# Patient Record
Sex: Male | Born: 2000 | Race: White | Hispanic: No | Marital: Single | Smoking: Current every day smoker
Health system: Southern US, Community
[De-identification: ages and names within clinical notes are randomized; demographics above are authoritative.]

## PROBLEM LIST (undated history)

## (undated) DIAGNOSIS — F913 Oppositional defiant disorder: Secondary | ICD-10-CM

## (undated) DIAGNOSIS — R454 Irritability and anger: Secondary | ICD-10-CM

## (undated) DIAGNOSIS — F909 Attention-deficit hyperactivity disorder, unspecified type: Secondary | ICD-10-CM

## (undated) HISTORY — DX: Attention-deficit hyperactivity disorder, unspecified type: F90.9

---
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
## 2019-06-08 ENCOUNTER — Other Ambulatory Visit: Payer: Self-pay

## 2019-06-09 ENCOUNTER — Encounter: Payer: Self-pay | Admitting: Nurse Practitioner

## 2019-06-09 ENCOUNTER — Ambulatory Visit (INDEPENDENT_AMBULATORY_CARE_PROVIDER_SITE_OTHER): Payer: Medicaid Other | Admitting: Nurse Practitioner

## 2019-06-09 DIAGNOSIS — F19982 Other psychoactive substance use, unspecified with psychoactive substance-induced sleep disorder: Secondary | ICD-10-CM

## 2019-06-09 DIAGNOSIS — F913 Oppositional defiant disorder: Secondary | ICD-10-CM | POA: Diagnosis not present

## 2019-06-09 DIAGNOSIS — F902 Attention-deficit hyperactivity disorder, combined type: Secondary | ICD-10-CM

## 2019-06-09 MED ORDER — LISDEXAMFETAMINE DIMESYLATE 60 MG PO CAPS: 60.0000 mg | ORAL_CAPSULE | ORAL | 0 refills | Status: AC

## 2019-06-09 MED ORDER — GUANFACINE HCL 2 MG PO TABS: ORAL_TABLET | ORAL | 5 refills | Status: AC

## 2019-06-09 MED ORDER — CLONIDINE HCL 0.3 MG PO TABS: 0.3000 mg | ORAL_TABLET | Freq: Every day | ORAL | 3 refills | Status: AC

## 2019-06-09 NOTE — Patient Instructions (Signed)
Stress, Adult Stress is a normal reaction to life events. Stress is what you feel when life demands more than you are used to, or more than you think you can handle. Some stress can be useful, such as studying for a test or meeting a deadline at work. Stress that occurs too often or for too long can cause problems. It can affect your emotional health and interfere with relationships and normal daily activities. Too much stress can weaken your body's defense system (immune system) and increase your risk for physical illness. If you already have a medical problem, stress can make it worse. What are the causes? All sorts of life events can cause stress. An event that causes stress for one person may not be stressful for another person. Major life events, whether positive or negative, commonly cause stress. Examples include:  Losing a job or starting a new job.  Losing a loved one.  Moving to a new town or home.  Getting married or divorced.  Having a baby.  Getting injured or sick. Less obvious life events can also cause stress, especially if they occur day after day or in combination with each other. Examples include:  Working long hours.  Driving in traffic.  Caring for children.  Being in debt.  Being in a difficult relationship. What are the signs or symptoms? Stress can cause emotional symptoms, including:  Anxiety. This is feeling worried, afraid, on edge, overwhelmed, or out of control.  Anger, including irritation or impatience.  Depression. This is feeling sad, down, helpless, or guilty.  Trouble focusing, remembering, or making decisions. Stress can cause physical symptoms, including:  Aches and pains. These may affect your head, neck, back, stomach, or other areas of your body.  Tight muscles or a clenched jaw.  Low energy.  Trouble sleeping. Stress can cause unhealthy behaviors, including:  Eating to feel better (overeating) or skipping meals.  Working too  much or putting off tasks.  Smoking, drinking alcohol, or using drugs to feel better. How is this diagnosed? Stress is diagnosed through an assessment by your health care provider. He or she may diagnose this condition based on:  Your symptoms and any stressful life events.  Your medical history.  Tests to rule out other causes of your symptoms. Depending on your condition, your health care provider may refer you to a specialist for further evaluation. How is this treated?  Stress management techniques are the recommended treatment for stress. Medicine is not typically recommended for the treatment of stress. Techniques to reduce your reaction to stressful life events include:  Stress identification. Monitor yourself for symptoms of stress and identify what causes stress for you. These skills may help you to avoid or prepare for stressful events.  Time management. Set your priorities, keep a calendar of events, and learn to say no. Taking these actions can help you avoid making too many commitments. Techniques for coping with stress include:  Rethinking the problem. Try to think realistically about stressful events rather than ignoring them or overreacting. Try to find the positives in a stressful situation rather than focusing on the negatives.  Exercise. Physical exercise can release both physical and emotional tension. The key is to find a form of exercise that you enjoy and do it regularly.  Relaxation techniques. These relax the body and mind. The key is to find one or more that you enjoy and use the techniques regularly. Examples include: ? Meditation, deep breathing, or progressive relaxation techniques. ? Yoga or   tai chi. ? Biofeedback, mindfulness techniques, or journaling. ? Listening to music, being out in nature, or participating in other hobbies.  Practicing a healthy lifestyle. Eat a balanced diet, drink plenty of water, limit or avoid caffeine, and get plenty of  sleep.  Having a strong support network. Spend time with family, friends, or other people you enjoy being around. Express your feelings and talk things over with someone you trust. Counseling or talk therapy with a mental health professional may be helpful if you are having trouble managing stress on your own. Follow these instructions at home: Lifestyle   Avoid drugs.  Do not use any products that contain nicotine or tobacco, such as cigarettes, e-cigarettes, and chewing tobacco. If you need help quitting, ask your health care provider.  Limit alcohol intake to no more than 1 drink a day for nonpregnant women and 2 drinks a day for men. One drink equals 12 oz of beer, 5 oz of wine, or 1 oz of hard liquor  Do not use alcohol or drugs to relax.  Eat a balanced diet that includes fresh fruits and vegetables, whole grains, lean meats, fish, eggs, and beans, and low-fat dairy. Avoid processed foods and foods high in added fat, sugar, and salt.  Exercise at least 30 minutes on 5 or more days each week.  Get 7-8 hours of sleep each night. General instructions   Practice stress management techniques as discussed with your health care provider.  Drink enough fluid to keep your urine clear or pale yellow.  Take over-the-counter and prescription medicines only as told by your health care provider.  Keep all follow-up visits as told by your health care provider. This is important. Contact a health care provider if:  Your symptoms get worse.  You have new symptoms.  You feel overwhelmed by your problems and can no longer manage them on your own. Get help right away if:  You have thoughts of hurting yourself or others. If you ever feel like you may hurt yourself or others, or have thoughts about taking your own life, get help right away. You can go to your nearest emergency department or call:  Your local emergency services (911 in the [place]  A suicide crisis helpline, such as the  [place] at (316) 250-6172. This is open 24 hours a day. Summary  Stress is a normal reaction to life events. It can cause problems if it happens too often or for too long.  Practicing stress management techniques is the best way to treat stress.  Counseling or talk therapy with a mental health professional may be helpful if you are having trouble managing stress on your own. This information is not intended to replace advice given to you by your health care provider. Make sure you discuss any questions you have with your health care provider. Document Revised: 12/16/2018 Document Reviewed: 07/08/2016 Elsevier Patient Education  [place].

## 2019-06-09 NOTE — Progress Notes (Signed)
   Subjective:    Patient ID: Jordan Fleming, male    DOB: 06-23-2000, 19 y.o.   MRN: [id number]   Chief Complaint: Recheck ADHD   HPI Patient comes in  Today for recheck of his ADHD. He is currently on vyvanse 60 mg and tenex 2mg  daily. He takes clonidine to sleep at night. He says he id doing well without any side effects. He has not been taking daily as matter of fact he has not had it filled since 2019. He says he needs them because he cannot concentrate and his attitude is worsening.   Review of Systems  Constitutional: Negative for diaphoresis.  Eyes: Negative for pain.  Respiratory: Negative for shortness of breath.   Cardiovascular: Negative for chest pain, palpitations and leg swelling.  Gastrointestinal: Negative for abdominal pain.  Endocrine: Negative for polydipsia.  Skin: Negative for rash.  Neurological: Negative for dizziness, weakness and headaches.  Hematological: Does not bruise/bleed easily.  All other systems reviewed and are negative.      Objective:   Physical Exam Vitals and nursing note reviewed.  Constitutional:      Appearance: Normal appearance.  Cardiovascular:     Rate and Rhythm: Normal rate and regular rhythm.     Heart sounds: Normal heart sounds.  Pulmonary:     Breath sounds: Normal breath sounds.  Skin:    General: Skin is warm.  Neurological:     General: No focal deficit present.     Mental Status: He is alert and oriented to person, place, and time.  Psychiatric:        Mood and Affect: Mood normal.        Behavior: Behavior normal.    BP 124/80   Pulse 60   Temp 99.1 F (37.3 C) (Temporal)   Resp 20   Ht 5\' 9"  (1.753 m)   Wt 248 lb (112.5 kg)   SpO2 96%   BMI 36.62 kg/m        Assessment & Plan:  2020 in today with chief complaint of Recheck ADHD   1. ADHD (attention deficit hyperactivity disorder), combined type  - lisdexamfetamine (VYVANSE) 60 MG capsule; Take 1 capsule (60 mg total) by mouth every  morning.  Dispense: 30 capsule; Refill: 0 - lisdexamfetamine (VYVANSE) 60 MG capsule; Take 1 capsule (60 mg total) by mouth every morning.  Dispense: 30 capsule; Refill: 0 - lisdexamfetamine (VYVANSE) 60 MG capsule; Take 1 capsule (60 mg total) by mouth every morning.  Dispense: 30 capsule; Refill: 0  2. Oppositional defiant disorder Stress management - guanFACINE (TENEX) 2 MG tablet; TAKE 1 TABLET (2 MG TOTAL) BY MOUTH AT BEDTIME.  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  Dispense: 30 tablet; Refill: 3  Mary-Margaret , FNP

## 2019-07-07 ENCOUNTER — Other Ambulatory Visit: Payer: Self-pay

## 2019-07-07 ENCOUNTER — Ambulatory Visit: Payer: Medicaid Other | Admitting: Family

## 2019-07-11 ENCOUNTER — Encounter: Payer: Self-pay | Admitting: Nurse Practitioner

## 2020-01-04 ENCOUNTER — Other Ambulatory Visit: Payer: Self-pay

## 2020-01-04 ENCOUNTER — Encounter: Payer: Self-pay | Admitting: Family

## 2020-01-04 ENCOUNTER — Ambulatory Visit (INDEPENDENT_AMBULATORY_CARE_PROVIDER_SITE_OTHER): Payer: Medicaid Other | Admitting: Family

## 2020-01-04 VITALS — BP 127/86 | HR 63 | Temp 97.5°F | Ht 69.0 in | Wt 264.4 lb

## 2020-01-04 DIAGNOSIS — R001 Bradycardia, unspecified: Secondary | ICD-10-CM | POA: Diagnosis not present

## 2020-01-04 DIAGNOSIS — R55 Syncope and collapse: Secondary | ICD-10-CM | POA: Diagnosis not present

## 2020-01-04 NOTE — Progress Notes (Signed)
Subjective:    Patient ID: Taysean Wager, male    DOB: Feb 17, 2001, 19 y.o.   MRN: [id number]  Chief Complaint  Patient presents with  . Loss of Consciousness    2 mths, happens through out the day.   PT presents to the office today with complaints of dizziness, syncope episodes that started two months ago. He states this when this happens he "falls to the ground" and after about 5- 10 mins he "gets strength back" and is able to get back up. He has not been to the ED or Urgent Care.   He states he was talking to his sister and felt dizzy and then his head "just fell down".    Denies any SOB, chest pain, palpitations, edema, or fatigue. No hx of seizures.  Loss of Consciousness This is a new problem. The current episode started more than 1 month ago. The problem has been gradually worsening. Associated symptoms include weakness. Pertinent negatives include no fever.      Review of Systems  Constitutional: Negative for fatigue and fever.  Cardiovascular: Positive for syncope.  Endocrine: Negative for polydipsia, polyphagia and polyuria.  Neurological: Positive for syncope and weakness.  All other systems reviewed and are negative.      Objective:   Physical Exam Vitals reviewed.  Constitutional:      General: He is not in acute distress.    Appearance: He is well-developed. He is obese.  HENT:     Head: Normocephalic.     Right Ear: Tympanic membrane normal.     Left Ear: Tympanic membrane normal.  Eyes:     General:        Right eye: No discharge.        Left eye: No discharge.     Pupils: Pupils are equal, round, and reactive to light.  Neck:     Thyroid: No thyromegaly.  Cardiovascular:     Rate and Rhythm: Normal rate and regular rhythm.     Heart sounds: Normal heart sounds. No murmur heard.   Pulmonary:     Effort: Pulmonary effort is normal. No respiratory distress.     Breath sounds: Normal breath sounds. No wheezing.  Abdominal:     General: Bowel sounds  are normal. There is no distension.     Palpations: Abdomen is soft.     Tenderness: There is no abdominal tenderness.  Musculoskeletal:        General: No tenderness. Normal range of motion.     Cervical back: Normal range of motion and neck supple.  Skin:    General: Skin is warm and dry.     Findings: No erythema or rash.  Neurological:     Mental Status: He is alert and oriented to person, place, and time.     Cranial Nerves: No cranial nerve deficit.     Deep Tendon Reflexes: Reflexes are normal and symmetric.  Psychiatric:        Behavior: Behavior normal.        Thought Content: Thought content normal.        Judgment: Judgment normal.       BP 127/86   Pulse 63   Temp (!) 97.5 F (36.4 C) (Temporal)   Ht _0  (1.753 m)   Wt 264 lb 6.4 oz (119.9 kg)   SpO2 94%   BMI 39.05 kg/m      Assessment & Plan:  Abdallah Hern comes in today with chief complaint of Loss of  Consciousness (2 mths, happens through out the day.)   Diagnosis and orders addressed:  1. Syncope, unspecified syncope type - EKG 12-Lead - Ambulatory referral to Neurology - Anemia Profile B - CMP14+EGFR - TSH - Ambulatory referral to Cardiology - ToxASSURE Select 13 (MW), Urine  2. Bradycardia - Ambulatory referral to Cardiology - ToxASSURE Select 13 (MW), Urine   Go to ED if symptoms worsen Referral to Cardiologists and Neurologists  Labs pending  No driving until cleared   Evelina Dun, FNP

## 2020-01-04 NOTE — Patient Instructions (Signed)
Syncope  Syncope refers to a condition in which a person temporarily loses consciousness. Syncope may also be called fainting or passing out. It is caused by a sudden decrease in blood flow to the brain. Even though most causes of syncope are not dangerous, syncope can be a sign of a serious medical problem. Your health care provider may do tests to find the reason why you are having syncope. Signs that you may be about to faint include:  Feeling dizzy or light-headed.  Feeling nauseous.  Seeing all white or all black in your field of vision.  Having cold, clammy skin. If you faint, get medical help right away. Call your local emergency services (911 in the [place] Do not drive yourself to the hospital. Follow these instructions at home: Pay attention to any changes in your symptoms. Take these actions to stay safe and to help relieve your symptoms: Lifestyle  Do not drive, use machinery, or play sports until your health care provider says it is okay.  Do not drink alcohol.  Do not use any products that contain nicotine or tobacco, such as cigarettes and e-cigarettes. If you need help quitting, ask your health care provider.  Drink enough fluid to keep your urine pale yellow. General instructions  Take over-the-counter and prescription medicines only as told by your health care provider.  If you are taking blood pressure or heart medicine, get up slowly and take several minutes to sit and then stand. This can reduce dizziness or light-headedness.  Have someone stay with you until you feel stable.  If you start to feel like you might faint, lie down right away and raise (elevate) your feet above the level of your heart. Breathe deeply and steadily. Wait until all the symptoms have passed.  Keep all follow-up visits as told by your health care provider. This is important. Get help right away if you:  Have a severe headache.  Faint once or repeatedly.  Have pain in your chest,  abdomen, or back.  Have a very fast or irregular heartbeat (palpitations).  Have pain when you breathe.  Are bleeding from your mouth or rectum, or you have black or tarry stool.  Have a seizure.  Are confused.  Have trouble walking.  Have severe weakness.  Have vision problems. These symptoms may represent a serious problem that is an emergency. Do not wait to see if your symptoms will go away. Get medical help right away. Call your local emergency services (911 in the [place] Do not drive yourself to the hospital. Summary  Syncope refers to a condition in which a person temporarily loses consciousness. It is caused by a sudden decrease in blood flow to the brain.  Signs that you may be about to faint include dizziness, feeling light-headed, feeling nauseous, sudden vision changes, or cold, clammy skin.  Although most causes of syncope are not dangerous, syncope can be a sign of a serious medical problem. If you faint, get medical help right away. This information is not intended to replace advice given to you by your health care provider. Make sure you discuss any questions you have with your health care provider. Document Revised: 04/30/2017 Document Reviewed: 04/26/2017 Elsevier Patient Education  2020 [employer]  

## 2020-01-08 LAB — TOXASSURE SELECT 13 (MW), URINE

## 2020-01-15 ENCOUNTER — Encounter: Payer: Self-pay | Admitting: General Practice

## 2020-08-26 ENCOUNTER — Ambulatory Visit: Payer: Medicaid Other | Admitting: Nurse Practitioner

## 2020-08-26 ENCOUNTER — Encounter: Payer: Self-pay | Admitting: Nurse Practitioner

## 2020-09-05 ENCOUNTER — Ambulatory Visit: Payer: Medicaid Other | Admitting: Nurse Practitioner

## 2020-09-19 ENCOUNTER — Encounter: Payer: Self-pay | Admitting: Nurse Practitioner
